# Patient Record
Sex: Male | Born: 1980 | Race: White | Hispanic: Yes | Marital: Married | Smoking: Never smoker
Health system: Southern US, Community
[De-identification: ages and names within clinical notes are randomized; demographics above are authoritative.]

## PROBLEM LIST (undated history)

## (undated) DIAGNOSIS — Z789 Other specified health status: Secondary | ICD-10-CM

---

## 2015-07-24 ENCOUNTER — Emergency Department: Payer: Worker's Compensation

## 2015-07-24 ENCOUNTER — Encounter: Payer: Self-pay | Admitting: Emergency Medicine

## 2015-07-24 ENCOUNTER — Emergency Department
Admission: EM | Admit: 2015-07-24 | Discharge: 2015-07-24 | Disposition: A | Discharge status: Home / Self Care | Payer: Worker's Compensation | Attending: Emergency Medicine | Admitting: Emergency Medicine

## 2015-07-24 DIAGNOSIS — S92002A Unspecified fracture of left calcaneus, initial encounter for closed fracture: Secondary | ICD-10-CM | POA: Diagnosis not present

## 2015-07-24 DIAGNOSIS — Y998 Other external cause status: Secondary | ICD-10-CM | POA: Insufficient documentation

## 2015-07-24 DIAGNOSIS — W1789XA Other fall from one level to another, initial encounter: Secondary | ICD-10-CM | POA: Diagnosis not present

## 2015-07-24 DIAGNOSIS — Y9389 Activity, other specified: Secondary | ICD-10-CM | POA: Diagnosis not present

## 2015-07-24 DIAGNOSIS — Y9289 Other specified places as the place of occurrence of the external cause: Secondary | ICD-10-CM | POA: Diagnosis not present

## 2015-07-24 DIAGNOSIS — S99912A Unspecified injury of left ankle, initial encounter: Secondary | ICD-10-CM | POA: Diagnosis present

## 2015-07-24 HISTORY — DX: Other specified health status: Z78.9

## 2015-07-24 MED ORDER — OXYCODONE-ACETAMINOPHEN 5-325 MG PO TABS
ORAL_TABLET | ORAL | Status: AC
  Administered 2015-07-24: 1 via ORAL
  Filled 2015-07-24: qty 1

## 2015-07-24 MED ORDER — HYDROMORPHONE HCL 1 MG/ML IJ SOLN
1.0000 mg | Freq: Once | INTRAMUSCULAR | Status: AC
  Administered 2015-07-24: 1 mg via INTRAMUSCULAR

## 2015-07-24 MED ORDER — OXYCODONE-ACETAMINOPHEN 7.5-325 MG PO TABS: 1.0000 | ORAL_TABLET | ORAL | Status: AC | PRN

## 2015-07-24 MED ORDER — OXYCODONE-ACETAMINOPHEN 5-325 MG PO TABS
1.0000 | ORAL_TABLET | Freq: Once | ORAL | Status: AC
Start: 2015-07-24 — End: 2015-07-24
  Administered 2015-07-24: 1 via ORAL

## 2015-07-24 MED ORDER — IBUPROFEN 800 MG PO TABS: 800.0000 mg | ORAL_TABLET | Freq: Three times a day (TID) | ORAL | Status: AC | PRN

## 2015-07-24 MED ORDER — HYDROMORPHONE HCL 1 MG/ML IJ SOLN
INTRAMUSCULAR | Status: AC
  Administered 2015-07-24: 1 mg via INTRAMUSCULAR
  Filled 2015-07-24: qty 1

## 2015-07-24 NOTE — ED Notes (Addendum)
Fell off 5 foot scaffold landing on left ankle.  Denies other injury.  Denies hitting head.  Denies passing out.  States this is worker's comp--[phone], contractor is [place], unsure of last name--he is coming to the ED.  Unable to raise left left from knee down, states painful in knee as well.  No visible swelling or deformity at this time, pedal pulse present but patient visibly in pain and states cannot straighten out leg due to pain.  PA contacted and xrays ordered.  Leg propped on pillow in wheelchair.

## 2015-07-24 NOTE — ED Provider Notes (Signed)
[facility] Emergency Department Provider Note  ____________________________________________  Time seen: Approximately 3:28 PM  I have reviewed the triage vital signs and the nursing notes.   HISTORY Via interpreter Chief Complaint Ankle Pain    HPI Alejandro Cannon is a 35 y.o. male patient plane of left ankle pain secondary to a fall. Patient fell off a 5 foot scaffoldinglanding on left heel. Patient states since the incident he is unable to bear weight and having excruciating pain. He is also refusing to straighten the leg secondary to fall. Patient denies any loss of sensation to the left lower extremity. Patient is rating his pain as a 10 over 10. No palate this measures taken for this complaint prior to arrival. This is a work related injury.   Past Medical History  Diagnosis Date  . Patient denies medical problems     There are no active problems to display for this patient.   No past surgical history on file.  Current Outpatient Rx  Name  Route  Sig  Dispense  Refill  . ibuprofen (ADVIL,MOTRIN) 800 MG tablet   Oral   Take 1 tablet (800 mg total) by mouth every 8 (eight) hours as needed.   30 tablet   0   . oxyCODONE-acetaminophen (PERCOCET) 7.5-325 MG tablet   Oral   Take 1 tablet by mouth every 4 (four) hours as needed for severe pain.   20 tablet   0     Allergies Review of patient's allergies indicates no known allergies.  No family history on file.  Social History Social History  Substance Use Topics  . Smoking status: Never Smoker   . Smokeless tobacco: Not on file  . Alcohol Use: Yes    Review of Systems Constitutional: No fever/chills Eyes: No visual changes. ENT: No sore throat. Cardiovascular: Denies chest pain. Respiratory: Denies shortness of breath. Gastrointestinal: No abdominal pain.  No nausea, no vomiting.  No diarrhea.  No constipation. Genitourinary: Negative for dysuria. Musculoskeletal: Negative  for back pain. Skin: Negative for rash. Neurological: Negative for headaches, focal weakness or numbness. 10-point ROS otherwise negative.  ____________________________________________   PHYSICAL EXAM:  VITAL SIGNS: ED Triage Vitals  Enc Vitals Group     BP 07/24/15 1449 128/73 mmHg     Pulse Rate 07/24/15 1449 93     Resp 07/24/15 1449 20     Temp 07/24/15 1449 98.4 F (36.9 C)     Temp Source 07/24/15 1449 Oral     SpO2 07/24/15 1449 100 %     Weight 07/24/15 1449 136 lb 11 oz (62 kg)     Height 07/24/15 1449  (1.6 m)     Head Cir --      Peak Flow --      Pain Score 07/24/15 1449 8     Pain Loc --      Pain Edu? --      Excl. in GC? --     Constitutional: Alert and oriented. Well appearing and in no acute distress. Eyes: Conjunctivae are normal. PERRL. EOMI. Head: Atraumatic. Nose: No congestion/rhinnorhea. Mouth/Throat: Mucous membranes are moist.  Oropharynx non-erythematous. Neck: No stridor.  No cervical spine tenderness to palpation. Hematological/Lymphatic/Immunilogical: No cervical lymphadenopathy. Cardiovascular: Normal rate, regular rhythm. Grossly normal heart sounds.  Good peripheral circulation. Respiratory: Normal respiratory effort.  No retractions. Lungs CTAB. Gastrointestinal: Soft and nontender. No distention. No abdominal bruits. No CVA tenderness. Musculoskeletal: No obvious deformity of the left lower extremity. Patient  decreased range of motion with extension of the left lower extremity. Patient is tender palpation lateral ankle and heel. Neurologic:  Normal speech and language. No gross focal neurologic deficits are appreciated. No gait instability. Skin:  Skin is warm, dry and intact. No rash noted. Psychiatric: Mood and affect are normal. Speech and behavior are normal.  ____________________________________________   LABS (all labs ordered are listed, but only abnormal results are displayed)  Labs Reviewed - No data to  display ____________________________________________  EKG   ____________________________________________  RADIOLOGY  Mid calcaneus ankle fracture minimal displacement. I, Joni Reining, personally viewed and evaluated these images (plain radiographs) as part of my medical decision making, as well as reviewing the written report by the radiologist.  ____________________________________________   PROCEDURES  Procedure(s) performed: None  Critical Care performed: No  ____________________________________________   INITIAL IMPRESSION / ASSESSMENT AND PLAN / ED COURSE  Pertinent labs & imaging results that were available during my care of the patient were reviewed by me and considered in my medical decision making (see chart for details).  Calcaneus fracture of the left ankle. Patient placed in a posterior ankle splint and given crutches to ambulate. Awaiting second call for orthopedics for follow-up evaluation appointment time. Discussed findings with Dr. Martha Clan and he advised that CT scan for patient before discharge. Patient will call in the morning for appointment follow-up time.____________________________________________   FINAL CLINICAL IMPRESSION(S) / ED DIAGNOSES  Final diagnoses:  Heel fracture, left, closed, initial encounter      Joni Reining, PA-C 07/24/15 1750  Joni Reining, PA-C 07/24/15 2018  Emily Filbert, MD 07/25/15 [phone]

## 2015-07-24 NOTE — Discharge Instructions (Signed)
Reparacin de la fractura del calcneo (Calcaneal Fracture Repair) Hay muchas formas diferentes de tratar las fracturas del hueso largo irregular del pie que forma el taln (calcneo). Las fracturas del hueso calcneo pueden tratarse con:   Inmovilizacin: la fractura ser [employer] est, sin cambiar la posicin de la fractura involucrada.  Con reduccin cerrada: los huesos se Advertising account executive en la posicin correcta sin abrir Theatre manager de Printmaker.  Reduccin abierta y fijacin interna: el cirujano abre el sitio de Printmaker, y las partes seas se fijan en su lugar con algn tipo de elemento rgido (como tornillos).  Artrodesis primaria: la articulacin ha sufrido un dao de tal magnitud que se Animator tratamiento un procedimiento que dejar la articulacin rgida de [employer]. Habitualmente disminuye la funcin, sin embargo, generalmente la articulacin queda libre de [place]. INFORME A SU MDICO:  Cualquier alergia que tenga.  Todos los [employer] [place], incluyendo vitaminas, hierbas, gotas oftlmicas, cremas y [address] de [address].  Problemas previos que usted o los [employer] de su familia hayan tenido con el uso de anestsicos.  Enfermedades de [employer].  Cirugas previas.  Padecimientos mdicos. RIESGOS Y COMPLICACIONES En general, la reparacin de la fractura del calcneo es un procedimiento seguro. Sin embargo, Tree surgeon procedimiento, pueden surgir complicaciones. Las complicaciones posibles son:   Hinchazn del pie y el tobillo.  Infeccin de la herida o del hueso.  Artritis.  Dolor crnico en el pie.  Lesiones en los nervios.  Cogulos de [employer] piernas o los pulmones. ANTES DEL PROCEDIMIENTO  Consulte a su mdico si debe cambiar o suspender los medicamentos que toma habitualmente. Es posible que deba dejar de tomar ciertos medicamentos antes de la [place].  El mdico revisar  las radiografas y los estudios por imgenes. El Primary school teacher cul es el mejor abordaje quirrgico para Psychologist, forensic.  No coma ni beba nada durante al menos 8 horas previas al procedimiento, o segn le haya indicado su mdico.  Si fuma, no lo haga al [employer] previas a la [place].  Planifique para que alguien lo lleve de vuelta a su casa. Tambin pdale a alguna persona que lo ayude con sus actividades mientras se recupera. PROCEDIMIENTO   Le darn un medicamento para que pueda relajarse (sedante). Le administrarn medicamentos para hacerlo dormir durante el procedimiento (anestesia general). Estos medicamentos se aplican a travs de una va intravenosa (IV) que se coloca en una vena.  Tambin podrn administrarle un bloqueante nervioso o medicamento para adormecer (anestsico local )  para que se sienta confortable.  Una vez que se duerma, le higienizarn el pie y lo rasurarn si es necesario.  El [place] podr usar una tcnica percutnea o una ciruga abierta:  En el abordaje percutneo, se realizan pequeos cortes y se colocan clavos para reparar la fractura.  En la tcnica abierta, se realiza un corte a lo largo de Hydrographic surveyor externa del pie los trozos de huesos vuelven a colocarse juntos con instrumental. Podrn dejarle un drenaje para recolectar el lquido. Se retirar a los 3-4 [employer] del procedimiento.  El cirujano cerrar la incisin con grapas o puntos de sutura. DESPUS DEL PROCEDIMIENTO  Despus de la ciruga lo llevarn al rea de recuperacin donde un enfermero lo cuidar y Chief Operating Officer su evolucin durante 1 a 3 horas. Una vez que despierte, se encuentre estabilizado y pueda ingerir lquidos correctamente, si no ocurre un imprevisto, podr Programme researcher, broadcasting/film/video  a su casa.  Le darn medicamentos para el dolor si los necesita.  Le quitarn la va IV y el catter antes de darle el alta.   Esta informacin no tiene Theme park manager el consejo del  mdico. Asegrese de hacerle al mdico cualquier pregunta que tenga.   Document Released: 03/27/2005 Document Revised: 04/07/2013 Elsevier Interactive Patient Education 2016 [employer].  Cuidados del yeso o la frula (Cast or Splint Care) El yeso y las frulas sostienen los miembros lesionados y evitan que los huesos se muevan hasta que se curen.  CUIDADOS EN EL HOGAR  Mantenga el yeso o la frula al descubierto durante el tiempo de secado.  El yeso tarda entre 14 y 48 horas en secarse.  La fibra de vidrio se seca en menos de 1 hora.  No apoye el yeso sobre nada que sea ms duro que una almohada durante 24 horas.  No soporte ningn peso sobre el [employer]. No haga presin sobre el yeso. Espere a que el mdico lo autorice.  Mantenga el yeso o la frula secos.  Cbralos con una bolsa plstica cuando se d un bao o los [address]  [address] de [place].  Si tiene Corporate treasurer trax y la cintura (el tronco) bese con una esponja hasta que se lo quiten.  Si el yeso se moja, squelo con una toalla o un secador de cabello. Utilice el aire fro del secador.  Mantenga el yeso o la frula limpios. Limpie el yeso sucio con un pao hmedo.  Noponga objetos extraos debajo del yeso o de la frula.  No se rasque la piel por debajo del molde con ningn objeto. Si siente picazn, use un secador de cabello con aire fro sobre la zona que pica.  No recorte ni perfore el yeso.  No retire el relleno acolchado que se encuentra debajo del yeso.  Ejercite como le ha indicado el mdico las articulaciones que se encuentran cerca del yeso.  Eleve (levante) el miembro lesionado sobre 1  2 almohadas durante los primeros 1 a 3 das. SOLICITE AYUDA SI:  El yeso o la frula se quiebran.  Siente que el yeso o la frula estn muy apretados o muy flojos.  Siente una picazn intensa por debajo del yeso.  El yeso se moja o tiene una zona blanda.  Siente un feo [employer] proviene del interior del  [place].  Algn objeto se queda atascado bajo el yeso.  La piel que rodea el yeso enrojece o se vuelve sensible.  Le aparece dolor, o siente ms dolor luego de la colocacin del yeso. SOLICITE AYUDA DE INMEDIATO SI:  Observa un lquido que sale por el yeso.  No puede mover los dedos.  Los dedos estn de color azul o blanco, estn fros, le duelen y estn inflamados (hinchados).  Siente hormigueo o pierde la sensibilidad (adormecimiento) alrededor de la zona de la lesin.  Aumenta el dolor o la presin debajo del yeso.  Tiene problemas para respirar o Company secretary.  Siente dolor en el pecho.   Esta informacin no tiene Theme park manager el consejo del mdico. Asegrese de hacerle al mdico cualquier pregunta que tenga.   Document Released: 11/19/2010 Document Revised: 02/17/2013 Elsevier Interactive Patient Education 2016 [employer].  Reparacin de la fractura del calcneo (Calcaneal Fracture Repair) Hay muchas formas diferentes de tratar las fracturas del hueso largo irregular del pie que forma el taln (calcneo). Las fracturas del hueso calcneo pueden tratarse con:   Inmovilizacin:  la fractura ser [employer] est, sin cambiar la posicin de la fractura involucrada.  Con reduccin cerrada: los huesos se Advertising account executive en la posicin correcta sin abrir Theatre manager de Printmaker.  Reduccin abierta y fijacin interna: el cirujano abre el sitio de Printmaker, y las partes seas se fijan en su lugar con algn tipo de elemento rgido (como tornillos).  Artrodesis primaria: la articulacin ha sufrido un dao de tal magnitud que se Animator tratamiento un procedimiento que dejar la articulacin rgida de [employer]. Habitualmente disminuye la funcin, sin embargo, generalmente la articulacin queda libre de [place]. INFORME A SU MDICO:  Cualquier alergia que tenga.  Todos los [employer] [place], incluyendo  vitaminas, hierbas, gotas oftlmicas, cremas y [address] de [address].  Problemas previos que usted o los [employer] de su familia hayan tenido con el uso de anestsicos.  Enfermedades de [employer].  Cirugas previas.  Padecimientos mdicos. RIESGOS Y COMPLICACIONES En general, la reparacin de la fractura del calcneo es un procedimiento seguro. Sin embargo, Tree surgeon procedimiento, pueden surgir complicaciones. Las complicaciones posibles son:   Hinchazn del pie y el tobillo.  Infeccin de la herida o del hueso.  Artritis.  Dolor crnico en el pie.  Lesiones en los nervios.  Cogulos de [employer] piernas o los pulmones. ANTES DEL PROCEDIMIENTO  Consulte a su mdico si debe cambiar o suspender los medicamentos que toma habitualmente. Es posible que deba dejar de tomar ciertos medicamentos antes de la [place].  El mdico revisar las radiografas y los estudios por imgenes. El Primary school teacher cul es el mejor abordaje quirrgico para Psychologist, forensic.  No coma ni beba nada durante al menos 8 horas previas al procedimiento, o segn le haya indicado su mdico.  Si fuma, no lo haga al [employer] previas a la [place].  Planifique para que alguien lo lleve de vuelta a su casa. Tambin pdale a alguna persona que lo ayude con sus actividades mientras se recupera. PROCEDIMIENTO   Le darn un medicamento para que pueda relajarse (sedante). Le administrarn medicamentos para hacerlo dormir durante el procedimiento (anestesia general). Estos medicamentos se aplican a travs de una va intravenosa (IV) que se coloca en una vena.  Tambin podrn administrarle un bloqueante nervioso o medicamento para adormecer (anestsico local )  para que se sienta confortable.  Una vez que se duerma, le higienizarn el pie y lo rasurarn si es necesario.  El [place] podr usar una tcnica percutnea o una ciruga abierta:  En el abordaje percutneo, se  realizan pequeos cortes y se colocan clavos para reparar la fractura.  En la tcnica abierta, se realiza un corte a lo largo de Hydrographic surveyor externa del pie los trozos de huesos vuelven a colocarse juntos con instrumental. Podrn dejarle un drenaje para recolectar el lquido. Se retirar a los 3-4 [employer] del procedimiento.  El cirujano cerrar la incisin con grapas o puntos de sutura. DESPUS DEL PROCEDIMIENTO  Despus de la ciruga lo llevarn al rea de recuperacin donde un enfermero lo cuidar y Chief Operating Officer su evolucin durante 1 a 3 horas. Una vez que despierte, se encuentre estabilizado y pueda ingerir lquidos correctamente, si no ocurre un imprevisto, podr volver a su casa.  Le darn medicamentos para el dolor si los necesita.  Le quitarn la va IV y el catter antes de darle el alta.   Esta informacin no tiene Theme park manager el consejo  del mdico. Asegrese de hacerle al mdico cualquier pregunta que tenga.   Document Released: 03/27/2005 Document Revised: 04/07/2013 Elsevier Interactive Patient Education [employer]

## 2015-07-24 NOTE — ED Notes (Signed)
Splint had been removed for CT scan and is now being placed back on pts leg.

## 2015-07-24 NOTE — ED Notes (Signed)
Interpreter, RN, radiology and family of pt in room discussing workers comp., treatment and plan of care.

## 2017-04-22 IMAGING — CR DG TIBIA/FIBULA 2V*L*
1 series · 4 of 4 positions shown · non-contrast
Comparison: None.

CLINICAL DATA: Pain, injury, unable to stand

EXAM:
LEFT TIBIA AND FIBULA - 2 VIEW

[Series 1: x tib-fib lat left · 0.14mm/px · 4 of 4 slices shown]
[im 1/4]
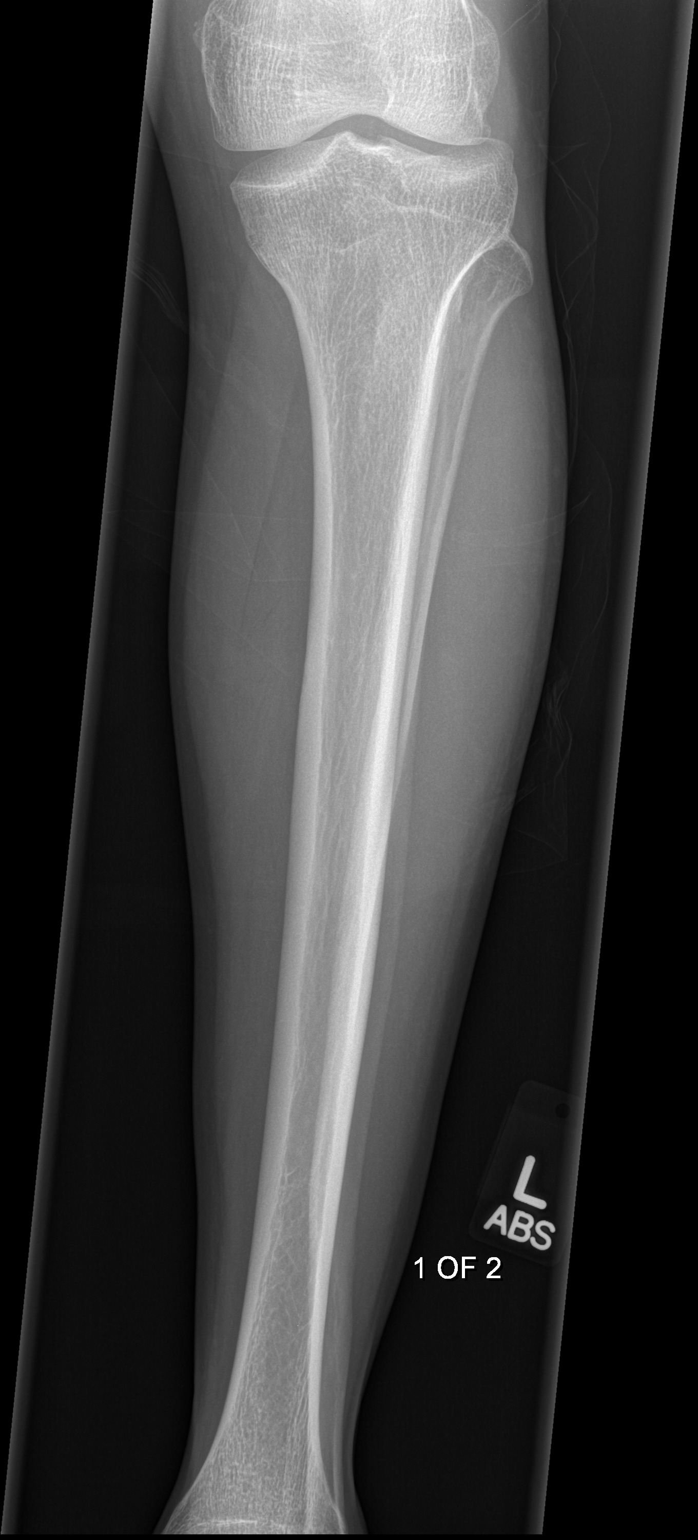
[im 2/4]
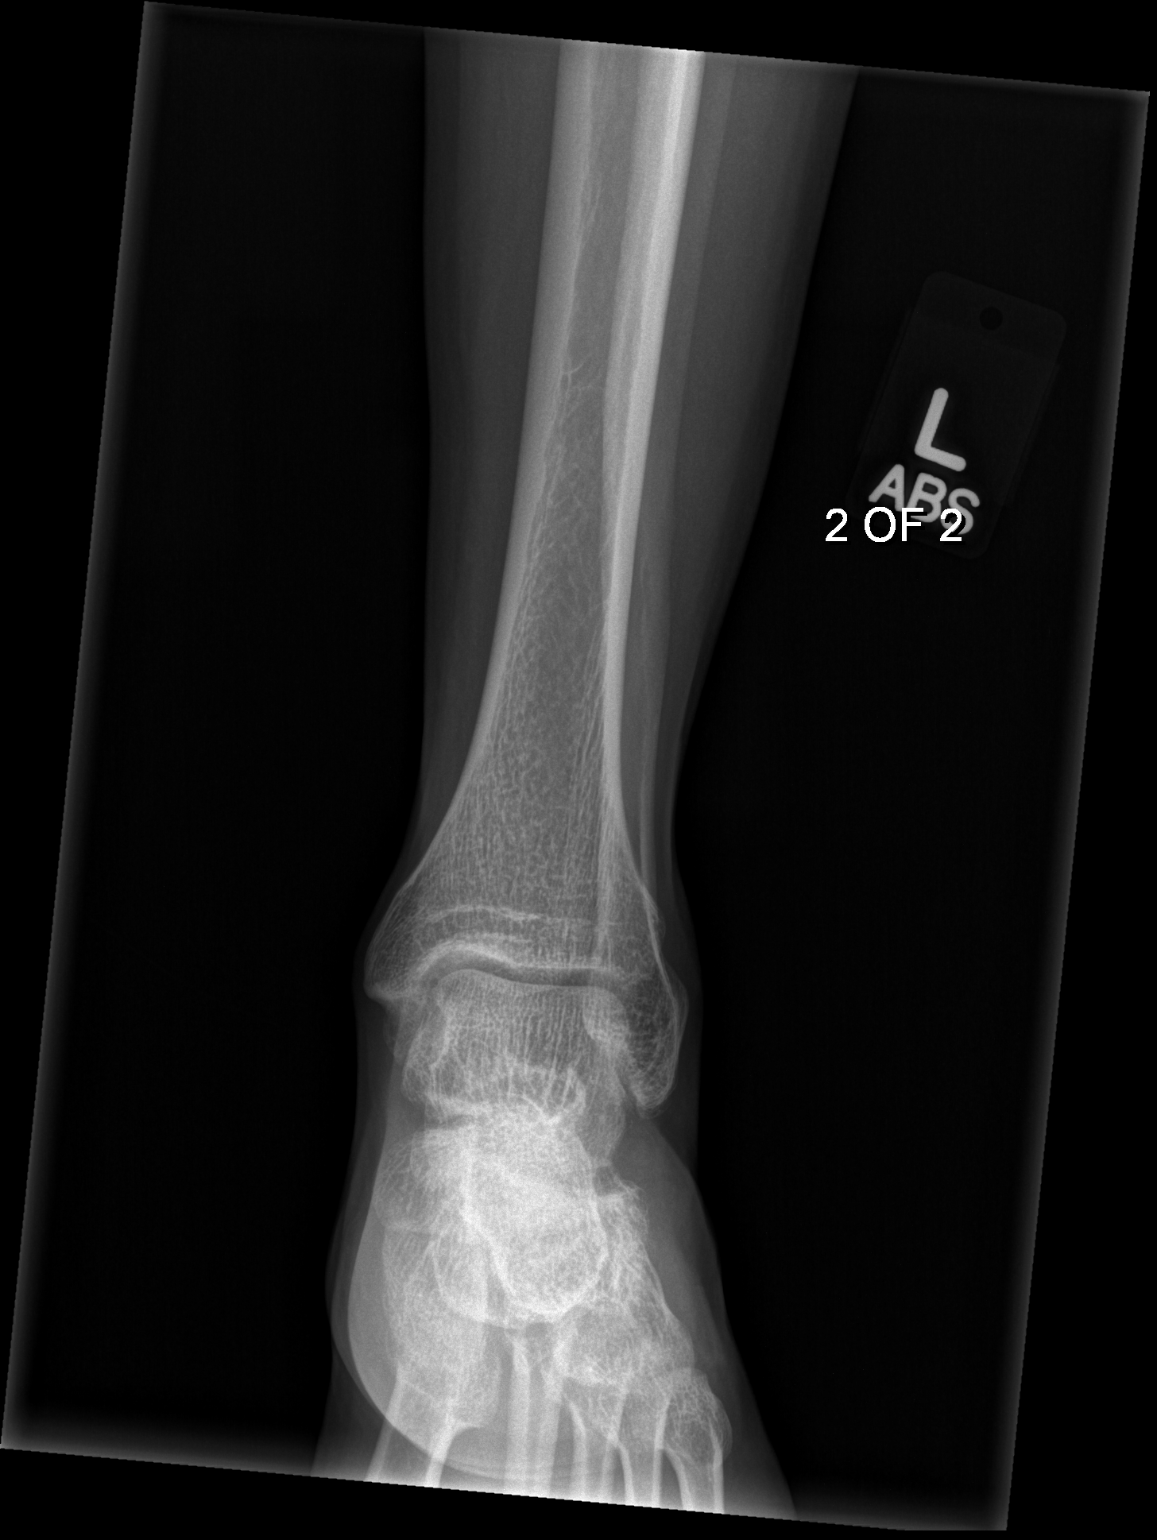
[im 3/4]
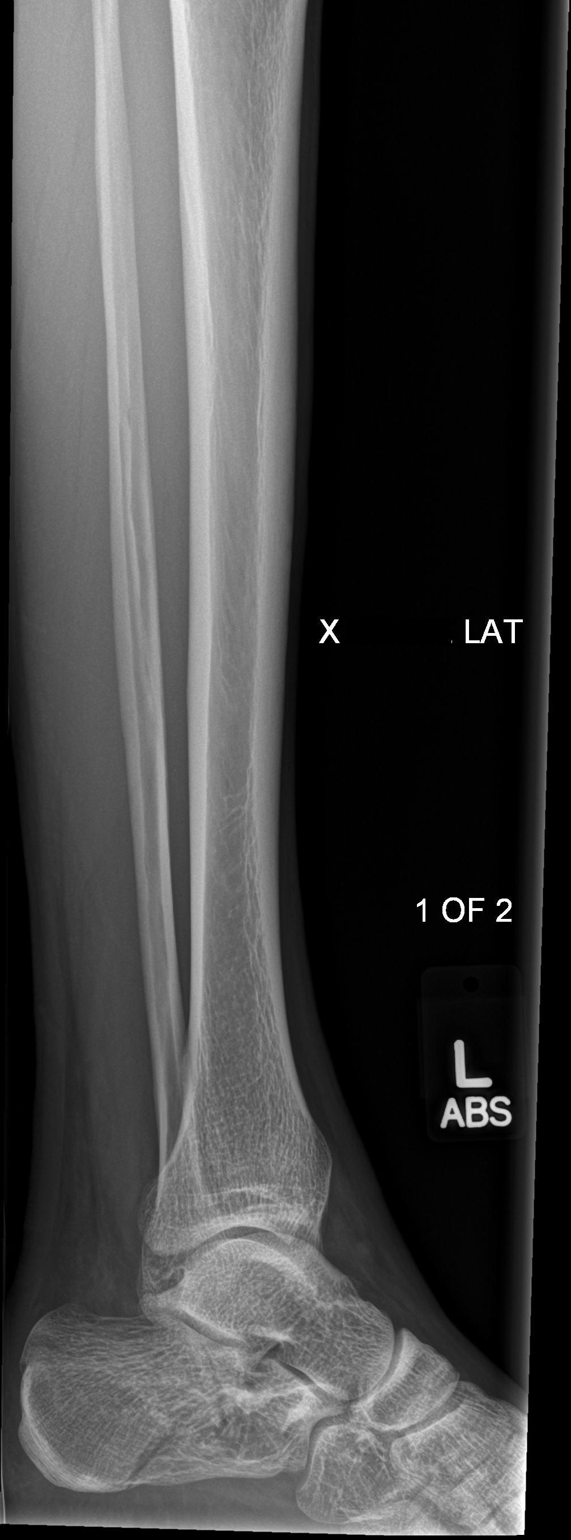
[im 4/4]
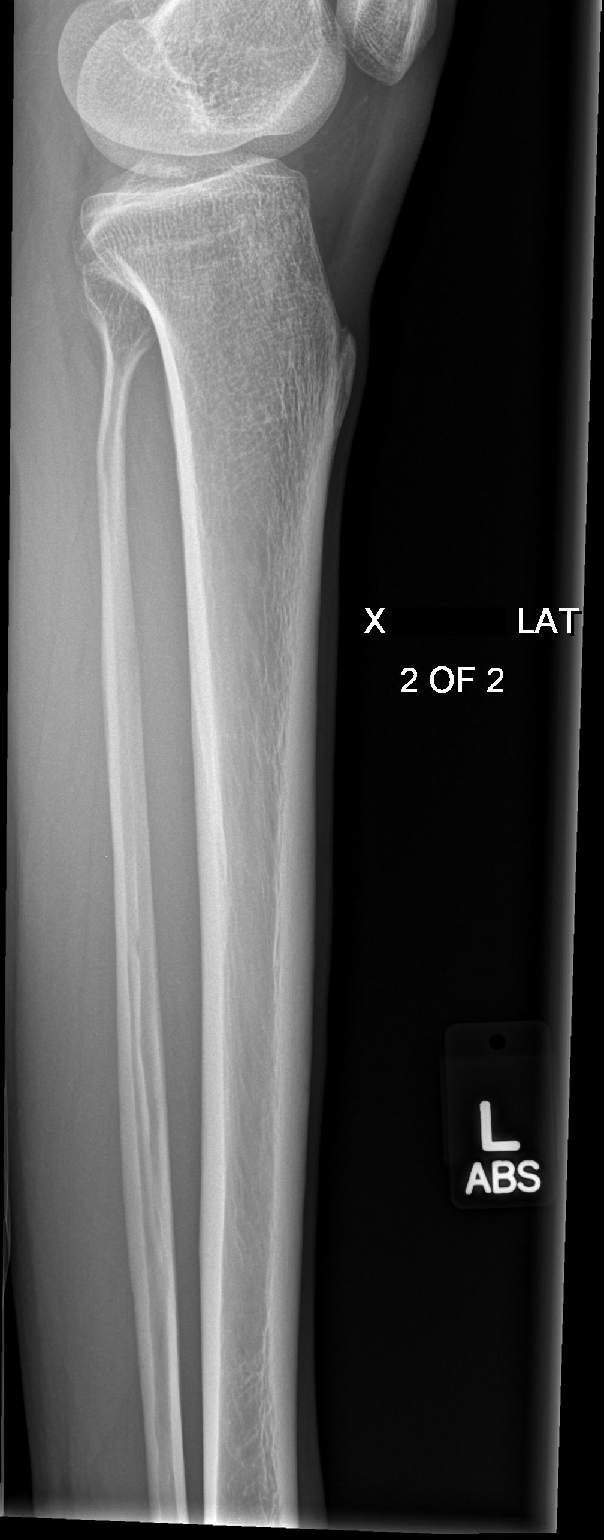

[4 of 4 positions shown; findings below may reference images not displayed]

FINDINGS: There is a comminuted left calcaneal fracture. There is no acute
fracture or dislocation of the left tibia or fibula. Soft tissues
are unremarkable.
IMPRESSION: Comminuted left calcaneal fracture.
